# Patient Record
Sex: Female | Born: 1960 | Race: White | Hispanic: No | Marital: Married | Smoking: Never smoker
Health system: Southern US, Community
[De-identification: ages and names within clinical notes are randomized; demographics above are authoritative.]

## PROBLEM LIST (undated history)

## (undated) DIAGNOSIS — I10 Essential (primary) hypertension: Secondary | ICD-10-CM

## (undated) HISTORY — PX: ELBOW SURGERY: SHX618

## (undated) HISTORY — PX: BREAST BIOPSY: SHX20

---

## 1998-02-21 ENCOUNTER — Inpatient Hospital Stay (HOSPITAL_COMMUNITY): Admission: AD | Admit: 1998-02-21 | Discharge: 1998-02-21 | Payer: Self-pay | Admitting: Obstetrics and Gynecology

## 1998-03-03 ENCOUNTER — Inpatient Hospital Stay (HOSPITAL_COMMUNITY): Admission: AD | Admit: 1998-03-03 | Discharge: 1998-03-06 | Payer: Self-pay | Admitting: Obstetrics and Gynecology

## 1999-04-20 ENCOUNTER — Other Ambulatory Visit: Admission: RE | Admit: 1999-04-20 | Discharge: 1999-04-20 | Payer: Self-pay | Admitting: Obstetrics and Gynecology

## 1999-09-11 ENCOUNTER — Encounter: Payer: Self-pay | Admitting: *Deleted

## 1999-09-11 ENCOUNTER — Emergency Department (HOSPITAL_COMMUNITY): Admission: EM | Admit: 1999-09-11 | Discharge: 1999-09-11 | Payer: Self-pay | Admitting: *Deleted

## 2000-03-10 ENCOUNTER — Other Ambulatory Visit: Admission: RE | Admit: 2000-03-10 | Discharge: 2000-03-10 | Payer: Self-pay | Admitting: Obstetrics and Gynecology

## 2001-08-21 ENCOUNTER — Other Ambulatory Visit: Admission: RE | Admit: 2001-08-21 | Discharge: 2001-08-21 | Payer: Self-pay | Admitting: Obstetrics and Gynecology

## 2004-03-17 ENCOUNTER — Encounter: Admission: RE | Admit: 2004-03-17 | Discharge: 2004-03-17 | Payer: Self-pay | Admitting: Obstetrics and Gynecology

## 2012-12-04 ENCOUNTER — Other Ambulatory Visit (HOSPITAL_COMMUNITY): Payer: Self-pay | Admitting: Family Medicine

## 2012-12-04 DIAGNOSIS — Z139 Encounter for screening, unspecified: Secondary | ICD-10-CM

## 2012-12-07 ENCOUNTER — Ambulatory Visit (HOSPITAL_COMMUNITY)
Admission: RE | Admit: 2012-12-07 | Discharge: 2012-12-07 | Disposition: A | Discharge status: Home / Self Care | Payer: 59 | Source: Ambulatory Visit | Attending: Family Medicine | Admitting: Family Medicine

## 2012-12-07 DIAGNOSIS — Z1231 Encounter for screening mammogram for malignant neoplasm of breast: Secondary | ICD-10-CM | POA: Insufficient documentation

## 2012-12-07 DIAGNOSIS — Z139 Encounter for screening, unspecified: Secondary | ICD-10-CM

## 2012-12-13 ENCOUNTER — Other Ambulatory Visit: Payer: Self-pay | Admitting: Family Medicine

## 2012-12-13 DIAGNOSIS — R928 Other abnormal and inconclusive findings on diagnostic imaging of breast: Secondary | ICD-10-CM

## 2012-12-18 ENCOUNTER — Ambulatory Visit
Admission: RE | Admit: 2012-12-18 | Discharge: 2012-12-18 | Disposition: A | Discharge status: Home / Self Care | Payer: 59 | Source: Ambulatory Visit | Attending: Family Medicine | Admitting: Family Medicine

## 2012-12-18 ENCOUNTER — Other Ambulatory Visit: Payer: Self-pay | Admitting: Family Medicine

## 2012-12-18 DIAGNOSIS — R928 Other abnormal and inconclusive findings on diagnostic imaging of breast: Secondary | ICD-10-CM

## 2012-12-20 ENCOUNTER — Ambulatory Visit
Admission: RE | Admit: 2012-12-20 | Discharge: 2012-12-20 | Disposition: A | Discharge status: Home / Self Care | Payer: 59 | Source: Ambulatory Visit | Attending: Family Medicine | Admitting: Family Medicine

## 2012-12-20 ENCOUNTER — Other Ambulatory Visit: Payer: Self-pay | Admitting: Family Medicine

## 2012-12-20 DIAGNOSIS — R928 Other abnormal and inconclusive findings on diagnostic imaging of breast: Secondary | ICD-10-CM

## 2016-04-29 ENCOUNTER — Other Ambulatory Visit: Payer: Self-pay | Admitting: Family Medicine

## 2016-04-29 DIAGNOSIS — N63 Unspecified lump in unspecified breast: Secondary | ICD-10-CM

## 2016-05-04 ENCOUNTER — Other Ambulatory Visit: Payer: Self-pay

## 2016-05-06 ENCOUNTER — Ambulatory Visit
Admission: RE | Admit: 2016-05-06 | Discharge: 2016-05-06 | Disposition: A | Discharge status: Home / Self Care | Payer: Managed Care, Other (non HMO) | Source: Ambulatory Visit | Attending: Family Medicine | Admitting: Family Medicine

## 2016-05-06 DIAGNOSIS — N63 Unspecified lump in unspecified breast: Secondary | ICD-10-CM

## 2016-10-18 ENCOUNTER — Other Ambulatory Visit (HOSPITAL_COMMUNITY): Payer: Self-pay | Admitting: Family Medicine

## 2016-10-18 ENCOUNTER — Ambulatory Visit (HOSPITAL_COMMUNITY)
Admission: RE | Admit: 2016-10-18 | Discharge: 2016-10-18 | Disposition: A | Discharge status: Home / Self Care | Payer: Managed Care, Other (non HMO) | Source: Ambulatory Visit | Attending: Family Medicine | Admitting: Family Medicine

## 2016-10-18 DIAGNOSIS — Z1389 Encounter for screening for other disorder: Secondary | ICD-10-CM | POA: Insufficient documentation

## 2016-10-18 DIAGNOSIS — J209 Acute bronchitis, unspecified: Secondary | ICD-10-CM

## 2016-10-18 DIAGNOSIS — J069 Acute upper respiratory infection, unspecified: Secondary | ICD-10-CM

## 2017-04-07 ENCOUNTER — Other Ambulatory Visit: Payer: Self-pay | Admitting: Family Medicine

## 2017-04-07 DIAGNOSIS — Z1231 Encounter for screening mammogram for malignant neoplasm of breast: Secondary | ICD-10-CM

## 2017-05-10 ENCOUNTER — Ambulatory Visit
Admission: RE | Admit: 2017-05-10 | Discharge: 2017-05-10 | Disposition: A | Discharge status: Home / Self Care | Payer: Managed Care, Other (non HMO) | Source: Ambulatory Visit | Attending: Family Medicine | Admitting: Family Medicine

## 2017-05-10 DIAGNOSIS — Z1231 Encounter for screening mammogram for malignant neoplasm of breast: Secondary | ICD-10-CM

## 2018-04-13 ENCOUNTER — Other Ambulatory Visit: Payer: Self-pay | Admitting: Family Medicine

## 2018-04-13 DIAGNOSIS — Z1231 Encounter for screening mammogram for malignant neoplasm of breast: Secondary | ICD-10-CM

## 2018-05-12 ENCOUNTER — Ambulatory Visit
Admission: RE | Admit: 2018-05-12 | Discharge: 2018-05-12 | Disposition: A | Discharge status: Home / Self Care | Payer: Self-pay | Source: Ambulatory Visit | Attending: Family Medicine | Admitting: Family Medicine

## 2018-05-12 DIAGNOSIS — Z1231 Encounter for screening mammogram for malignant neoplasm of breast: Secondary | ICD-10-CM

## 2019-04-05 ENCOUNTER — Encounter (HOSPITAL_COMMUNITY): Payer: Self-pay | Admitting: Emergency Medicine

## 2019-04-05 ENCOUNTER — Emergency Department (HOSPITAL_COMMUNITY)
Admission: EM | Admit: 2019-04-05 | Discharge: 2019-04-06 | Disposition: A | Discharge status: Home / Self Care | Payer: 59 | Attending: Emergency Medicine | Admitting: Emergency Medicine

## 2019-04-05 ENCOUNTER — Other Ambulatory Visit: Payer: Self-pay

## 2019-04-05 DIAGNOSIS — T7840XA Allergy, unspecified, initial encounter: Secondary | ICD-10-CM | POA: Insufficient documentation

## 2019-04-05 DIAGNOSIS — L509 Urticaria, unspecified: Secondary | ICD-10-CM | POA: Diagnosis not present

## 2019-04-05 DIAGNOSIS — L299 Pruritus, unspecified: Secondary | ICD-10-CM | POA: Diagnosis present

## 2019-04-05 MED ORDER — METHYLPREDNISOLONE SODIUM SUCC 125 MG IJ SOLR
125.0000 mg | Freq: Once | INTRAMUSCULAR | Status: DC
  Filled 2019-04-05: qty 2

## 2019-04-05 MED ORDER — DIPHENHYDRAMINE HCL 50 MG/ML IJ SOLN
25.0000 mg | Freq: Once | INTRAMUSCULAR | Status: AC
  Administered 2019-04-05: 25 mg via INTRAVENOUS
  Filled 2019-04-05: qty 1

## 2019-04-05 MED ORDER — FAMOTIDINE IN NACL 20-0.9 MG/50ML-% IV SOLN
20.0000 mg | Freq: Once | INTRAVENOUS | Status: AC
  Administered 2019-04-05: 20 mg via INTRAVENOUS
  Filled 2019-04-05: qty 50

## 2019-04-05 NOTE — ED Notes (Signed)
Medications held at this time- Dr Tomi Bamberger made aware- pt may have had steroid shot today already.

## 2019-04-05 NOTE — ED Triage Notes (Signed)
Patient complaining of an allergic reaction. Patient denies any trouble breathing but has hives on her stomach and is itching. Patient was seen at the doctor's office for poison oak and given a shot and cream while she was there. Hives occurred tonight.

## 2019-04-06 NOTE — Discharge Instructions (Addendum)
Stay cool, heat will make the rash and itching worse.  Take Pepcid AC over-the-counter twice a day for at least a week.  You can take Claritin over-the-counter once a day.  Take Benadryl 25 to 50 mg every 6 hours as needed for itching not controlled by the Claritin.  Return to the emergency department if you have trouble breathing or swallowing.  Please call your doctor's office in the morning to see what injection you received so it can be placed on your allergy list.

## 2019-04-06 NOTE — ED Provider Notes (Signed)
[facility] Provider Note   CSN: [id number] Arrival date & time: 04/05/19  2221  Time seen 11:09 PM  History   Chief Complaint Chief Complaint  Patient presents with  . Allergic Reaction    HPI Teresa Kramer is a 58 y.o. female.     HPI patient states over the July 4 weekend she was putting out mulch and started getting poison oak on her left upper inner arm, right groin, forehead, chin, and a couple of her right fingers.  She states it was not going away and she had been using alcohol and some cream on it.  She was seen at her primary care doctor today and got a shot about 10:30 AM that I presume is probably a steroid shot.  She also was given a prescription for clobetasol.  She states she woke up about 9 PM itching all over and now has a urticarial type rash on her abdomen, arms, and itching on her feet.  She denies any difficulty speaking or swallowing.  She states she is never had a reaction to steroid shots in the past.  She states she did not use the clobetasol until she woke up itching.  PCP Assunta FoundGolding, John, MD   History reviewed. No pertinent past medical history.  There are no active problems to display for this patient.   Past Surgical History:  Procedure Laterality Date  . BREAST BIOPSY Left      OB History   No obstetric history on file.      Home Medications    Prior to Admission medications   Not on File    Family History Family History  Problem Relation Age of Onset  . Breast cancer Neg Hx     Social History Social History   Tobacco Use  . Smoking status: Never Smoker  . Smokeless tobacco: Never Used  Substance Use Topics  . Alcohol use: Not on file  . Drug use: Not on file  lives with spouse Denies alcohol use   Allergies   Patient has no known allergies.   Review of Systems Review of Systems  All other systems reviewed and are negative.    Physical Exam Updated Vital Signs BP (!) 135/96 (BP Location:  Right Arm)   Pulse 94   Temp 97.8 F (36.6 C) (Oral)   Resp 18   Ht 5\' 3"  (1.6 m)   Wt 95.3 kg   SpO2 96%   BMI 37.20 kg/m   Vital signs normal    Physical Exam Vitals signs and nursing note reviewed.  Constitutional:      General: She is not in acute distress.    Appearance: Normal appearance.  HENT:     Head: Normocephalic and atraumatic.     Right Ear: External ear normal.     Left Ear: External ear normal.     Nose: Nose normal.     Mouth/Throat:     Mouth: Mucous membranes are dry.  Eyes:     Extraocular Movements: Extraocular movements intact.     Conjunctiva/sclera: Conjunctivae normal.     Pupils: Pupils are equal, round, and reactive to light.  Neck:     Musculoskeletal: Normal range of motion.  Cardiovascular:     Rate and Rhythm: Normal rate and regular rhythm.     Pulses: Normal pulses.     Heart sounds: Normal heart sounds.  Pulmonary:     Effort: Pulmonary effort is normal. No respiratory distress.  Breath sounds: Normal breath sounds.  Musculoskeletal: Normal range of motion.        General: No swelling.  Skin:    Comments: Patient has a urticarial type coalescing rash in her right abdomen and a few scattered lesions behind her knees in the popliteal area and in her left upper inner arm.  She also has a drying area in her right inguinal area that was her poison oak and a few lesions on her left upper inner arm that she states was present before.  Neurological:     General: No focal deficit present.     Mental Status: She is alert and oriented to person, place, and time.     Cranial Nerves: No cranial nerve deficit.  Psychiatric:        Mood and Affect: Mood normal.        Behavior: Behavior normal.        Thought Content: Thought content normal.      ED Treatments / Results  Labs (all labs ordered are listed, but only abnormal results are displayed) Labs Reviewed - No data to display  EKG None  Radiology No results found.  Procedures  Procedures (including critical care time)  Medications Ordered in ED Medications  diphenhydrAMINE (BENADRYL) injection 25 mg (25 mg Intravenous Given 04/05/19 2343)  famotidine (PEPCID) IVPB 20 mg premix (0 mg Intravenous Stopped 04/06/19 0033)     Initial Impression / Assessment and Plan / ED Course  I have reviewed the triage vital signs and the nursing notes.  Pertinent labs & imaging results that were available during my care of the patient were reviewed by me and considered in my medical decision making (see chart for details).       Patient was given IV Pepcid and Benadryl.  She was not given IV Solu-Medrol.  Recheck 1:10 AM patient's rash is almost gone.  She states her itching is gone.  She was discharged home.  She is going to call her primary care doctor's office this morning to see what she was given so she can put it on her allergy list.  Final Clinical Impressions(s) / ED Diagnoses   Final diagnoses:  Allergic reaction, initial encounter  Urticarial rash    ED Discharge Orders    None    OTC Claritin, Pepcid, benadryl  Plan discharge  Rolland Porter, MD, Barbette Or, MD 04/06/19 [id number]

## 2019-06-28 ENCOUNTER — Encounter (HOSPITAL_COMMUNITY): Payer: Self-pay | Admitting: Emergency Medicine

## 2019-06-28 ENCOUNTER — Emergency Department (HOSPITAL_COMMUNITY)
Admission: EM | Admit: 2019-06-28 | Discharge: 2019-06-28 | Disposition: A | Discharge status: Home / Self Care | Payer: 59 | Attending: Emergency Medicine | Admitting: Emergency Medicine

## 2019-06-28 ENCOUNTER — Other Ambulatory Visit: Payer: Self-pay

## 2019-06-28 ENCOUNTER — Emergency Department (HOSPITAL_COMMUNITY): Payer: 59

## 2019-06-28 DIAGNOSIS — I10 Essential (primary) hypertension: Secondary | ICD-10-CM | POA: Diagnosis not present

## 2019-06-28 DIAGNOSIS — R079 Chest pain, unspecified: Secondary | ICD-10-CM | POA: Diagnosis not present

## 2019-06-28 DIAGNOSIS — M79602 Pain in left arm: Secondary | ICD-10-CM | POA: Insufficient documentation

## 2019-06-28 HISTORY — DX: Essential (primary) hypertension: I10

## 2019-06-28 LAB — BASIC METABOLIC PANEL
Anion gap: 8 (ref 5–15)
BUN: 11 mg/dL (ref 6–20)
CO2: 27 mmol/L (ref 22–32)
Calcium: 9.2 mg/dL (ref 8.9–10.3)
Chloride: 102 mmol/L (ref 98–111)
Creatinine, Ser: 0.74 mg/dL (ref 0.44–1.00)
GFR calc Af Amer: >60 mL/min (ref >60)
GFR calc non Af Amer: >60 mL/min (ref >60)
Glucose, Bld: 95 mg/dL (ref 70–99)
Potassium: 3.6 mmol/L (ref 3.5–5.1)
Sodium: 137 mmol/L (ref 135–145)

## 2019-06-28 LAB — CBC
HCT: 42.2 % (ref 36.0–46.0)
Hemoglobin: 13.6 g/dL (ref 12.0–15.0)
MCH: 30.2 pg (ref 26.0–34.0)
MCHC: 32.2 g/dL (ref 30.0–36.0)
MCV: 93.6 fL (ref 80.0–100.0)
Platelets: 245 10*3/uL (ref 150–400)
RBC: 4.51 MIL/uL (ref 3.87–5.11)
RDW: 12.1 % (ref 11.5–15.5)
WBC: 9.8 10*3/uL (ref 4.0–10.5)
nRBC: 0 % (ref 0.0–0.2)

## 2019-06-28 LAB — TROPONIN I (HIGH SENSITIVITY)
Troponin I (High Sensitivity): <2 ng/L (ref <18)
Troponin I (High Sensitivity): 3 ng/L (ref <18)

## 2019-06-28 MED ORDER — SODIUM CHLORIDE 0.9% FLUSH: 3.0000 mL | Freq: Once | INTRAVENOUS | Status: DC

## 2019-06-28 NOTE — ED Provider Notes (Signed)
[facility] Emergency Department Provider Note MRN:  [id number]  Arrival date & time: 06/28/19     Chief Complaint   Arm Pain   History of Present Illness   Teresa Kramer is a 58 y.o. year-old female with a history of HTN presenting to the ED with chief complaint of arm pain.  Location: Left arm Duration: 30 minutes Onset: Sudden Timing: Constant Description: "Just hurt real bad" Severity: Moderate Exacerbating/Alleviating Factors: None Associated Symptoms: "Just felt real funny", mild headache Pertinent Negatives: Denies diaphoresis, no dizziness, no nausea, no vomiting, no shortness of breath, no chest pain, no abdominal pain, no numbness or weakness to the arms or legs.   Review of Systems  A complete 10 system review of systems was obtained and all systems are negative except as noted in the HPI and PMH.   Patient's Health History    Past Medical History:  Diagnosis Date  . Hypertension     Past Surgical History:  Procedure Laterality Date  . BREAST BIOPSY Left   . ELBOW SURGERY      Family History  Problem Relation Age of Onset  . Breast cancer Neg Hx     Social History   Socioeconomic History  . Marital status: Married    Spouse name: Not on file  . Number of children: Not on file  . Years of education: Not on file  . Highest education level: Not on file  Occupational History  . Not on file  Social Needs  . Financial resource strain: Not on file  . Food insecurity    Worry: Not on file    Inability: Not on file  . Transportation needs    Medical: Not on file    Non-medical: Not on file  Tobacco Use  . Smoking status: Never Smoker  . Smokeless tobacco: Never Used  Substance and Sexual Activity  . Alcohol use: Not on file  . Drug use: Not on file  . Sexual activity: Not on file  Lifestyle  . Physical activity    Days per week: Not on file    Minutes per session: Not on file  . Stress: Not on file  Relationships  .  Social Herbalist on phone: Not on file    Gets together: Not on file    Attends religious service: Not on file    Active member of club or organization: Not on file    Attends meetings of clubs or organizations: Not on file    Relationship status: Not on file  . Intimate partner violence    Fear of current or ex partner: Not on file    Emotionally abused: Not on file    Physically abused: Not on file    Forced sexual activity: Not on file  Other Topics Concern  . Not on file  Social History Narrative  . Not on file     Physical Exam  Vital Signs and Nursing Notes reviewed Vitals:   06/28/19 1409  BP: (!) 171/82  Pulse: 75  Resp: 18  Temp: 98 F (36.7 C)  SpO2: 98%    CONSTITUTIONAL: Well-appearing, NAD NEURO:  Alert and oriented x 3, no focal deficits EYES:  eyes equal and reactive ENT/NECK:  no LAD, no JVD CARDIO: Regular rate, well-perfused, normal S1 and S2 PULM:  CTAB no wheezing or rhonchi GI/GU:  normal bowel sounds, non-distended, non-tender MSK/SPINE:  No gross deformities, no edema SKIN:  no rash, atraumatic PSYCH:  Appropriate speech and behavior  Diagnostic and Interventional Summary    EKG Interpretation  Date/Time:  Thursday June 28 2019 14:04:38 EDT Ventricular Rate:  71 PR Interval:  174 QRS Duration: 102 QT Interval:  378 QTC Calculation: 410 R Axis:   -44 Text Interpretation:  Normal sinus rhythm Left axis deviation Minimal voltage criteria for LVH, may be normal variant Possible Anterior infarct , age undetermined Abnormal ECG No old tracing to compare Confirmed by Meridee Score [phone]) on 06/28/2019 2:13:42 PM      Labs Reviewed  BASIC METABOLIC PANEL  CBC  TROPONIN I (HIGH SENSITIVITY)  TROPONIN I (HIGH SENSITIVITY)    DG Chest 2 View  Final Result      Medications  sodium chloride flush (NS) 0.9 % injection 3 mL (has no administration in time range)     Procedures Critical Care  ED Course and Medical Decision  Making  I have reviewed the triage vital signs and the nursing notes.  Pertinent labs & imaging results that were available during my care of the patient were reviewed by me and considered in my medical decision making (see below for details).  Considering atypical presentation of ACS with referred pain to the left arm in this 59 year old female with history of hypertension but otherwise does not smoke, has no cardiac history.  Her mother just passed away 3 weeks ago and there may be a component of anxiety contributing to her symptoms.  She also had onset of pain after lunch, suggestive of a possible GI etiology.  EKG is without acute concerns, troponin is pending.  No evidence of DVT on exam, little to no concern for pulmonary embolism.  First troponin is negative, per high-sensitivity troponin protocol and her very low heart score she is a candidate for early discharge.  Elmer Sow. Pilar Plate, MD [facility] Emergency Medicine [facility] [email or web address]  Final Clinical Impressions(s) / ED Diagnoses     ICD-10-CM   1. Left arm pain  M79.602     ED Discharge Orders    None      Discharge Instructions Discussed with and Provided to Patient:   Discharge Instructions     You were evaluated in the Emergency Department and after careful evaluation, we did not find any emergent condition requiring admission or further testing in the hospital.  Please return to the Emergency Department if you experience any worsening of your condition.  We encourage you to follow up with a primary care provider.  Thank you for allowing [place] to be a part of your care.       Sabas Sous, MD 06/28/19 1705

## 2019-06-28 NOTE — Discharge Instructions (Addendum)
You were evaluated in the Emergency Department and after careful evaluation, we did not find any emergent condition requiring admission or further testing in the hospital. ° °Please return to the Emergency Department if you experience any worsening of your condition.  We encourage you to follow up with a primary care provider.  Thank you for allowing us to be a part of your care. °

## 2019-06-28 NOTE — ED Triage Notes (Addendum)
Pt c/o of left arm pain and high blood pressure this morning.  Pain is better but still present. On HTN meds but not consistent. Mild chest pressure

## 2019-06-29 LAB — POC OCCULT BLOOD, ED: Fecal Occult Bld: NEGATIVE

## 2019-07-03 ENCOUNTER — Other Ambulatory Visit: Payer: Self-pay | Admitting: Family Medicine

## 2019-07-03 DIAGNOSIS — Z1231 Encounter for screening mammogram for malignant neoplasm of breast: Secondary | ICD-10-CM

## 2019-08-20 ENCOUNTER — Other Ambulatory Visit: Payer: Self-pay

## 2019-08-20 ENCOUNTER — Ambulatory Visit
Admission: RE | Admit: 2019-08-20 | Discharge: 2019-08-20 | Disposition: A | Discharge status: Home / Self Care | Payer: 59 | Source: Ambulatory Visit | Attending: Family Medicine | Admitting: Family Medicine

## 2019-08-20 DIAGNOSIS — Z1231 Encounter for screening mammogram for malignant neoplasm of breast: Secondary | ICD-10-CM

## 2019-08-22 ENCOUNTER — Other Ambulatory Visit: Payer: Self-pay | Admitting: Family Medicine

## 2019-08-22 DIAGNOSIS — R928 Other abnormal and inconclusive findings on diagnostic imaging of breast: Secondary | ICD-10-CM

## 2019-08-31 ENCOUNTER — Other Ambulatory Visit: Payer: Self-pay

## 2019-08-31 ENCOUNTER — Ambulatory Visit: Payer: 59

## 2019-08-31 ENCOUNTER — Ambulatory Visit
Admission: RE | Admit: 2019-08-31 | Discharge: 2019-08-31 | Disposition: A | Discharge status: Home / Self Care | Payer: 59 | Source: Ambulatory Visit | Attending: Family Medicine | Admitting: Family Medicine

## 2019-08-31 DIAGNOSIS — R928 Other abnormal and inconclusive findings on diagnostic imaging of breast: Secondary | ICD-10-CM

## 2019-09-22 ENCOUNTER — Emergency Department (HOSPITAL_COMMUNITY): Payer: 59

## 2019-09-22 ENCOUNTER — Emergency Department (HOSPITAL_COMMUNITY)
Admission: EM | Admit: 2019-09-22 | Discharge: 2019-09-22 | Disposition: A | Discharge status: Home / Self Care | Payer: 59 | Attending: Emergency Medicine | Admitting: Emergency Medicine

## 2019-09-22 ENCOUNTER — Other Ambulatory Visit: Payer: Self-pay

## 2019-09-22 ENCOUNTER — Encounter (HOSPITAL_COMMUNITY): Payer: Self-pay | Admitting: Emergency Medicine

## 2019-09-22 DIAGNOSIS — R079 Chest pain, unspecified: Secondary | ICD-10-CM | POA: Diagnosis present

## 2019-09-22 DIAGNOSIS — I10 Essential (primary) hypertension: Secondary | ICD-10-CM | POA: Diagnosis not present

## 2019-09-22 DIAGNOSIS — R1011 Right upper quadrant pain: Secondary | ICD-10-CM | POA: Insufficient documentation

## 2019-09-22 DIAGNOSIS — R0789 Other chest pain: Secondary | ICD-10-CM | POA: Diagnosis not present

## 2019-09-22 LAB — COMPREHENSIVE METABOLIC PANEL
ALT: 22 U/L (ref 0–44)
AST: 21 U/L (ref 15–41)
Albumin: 4.6 g/dL (ref 3.5–5.0)
Alkaline Phosphatase: 75 U/L (ref 38–126)
Anion gap: 10 (ref 5–15)
BUN: 11 mg/dL (ref 6–20)
CO2: 25 mmol/L (ref 22–32)
Calcium: 9.1 mg/dL (ref 8.9–10.3)
Chloride: 105 mmol/L (ref 98–111)
Creatinine, Ser: 0.67 mg/dL (ref 0.44–1.00)
GFR calc Af Amer: >60 mL/min (ref >60)
GFR calc non Af Amer: >60 mL/min (ref >60)
Glucose, Bld: 111 mg/dL — ABNORMAL HIGH (ref 70–99)
Potassium: 3.7 mmol/L (ref 3.5–5.1)
Sodium: 140 mmol/L (ref 135–145)
Total Bilirubin: 0.4 mg/dL (ref 0.3–1.2)
Total Protein: 8 g/dL (ref 6.5–8.1)

## 2019-09-22 LAB — URINALYSIS, ROUTINE W REFLEX MICROSCOPIC
Bilirubin Urine: NEGATIVE
Glucose, UA: NEGATIVE mg/dL
Hgb urine dipstick: NEGATIVE
Ketones, ur: NEGATIVE mg/dL
Leukocytes,Ua: NEGATIVE
Nitrite: NEGATIVE
Protein, ur: NEGATIVE mg/dL
Specific Gravity, Urine: 1.013 (ref 1.005–1.030)
pH: 8 (ref 5.0–8.0)

## 2019-09-22 LAB — CBC WITH DIFFERENTIAL/PLATELET
Abs Immature Granulocytes: 0.1 10*3/uL — ABNORMAL HIGH (ref 0.00–0.07)
Basophils Absolute: 0.1 10*3/uL (ref 0.0–0.1)
Basophils Relative: 1 %
Eosinophils Absolute: 0.1 10*3/uL (ref 0.0–0.5)
Eosinophils Relative: 0 %
HCT: 43.3 % (ref 36.0–46.0)
Hemoglobin: 14.1 g/dL (ref 12.0–15.0)
Immature Granulocytes: 1 %
Lymphocytes Relative: 10 %
Lymphs Abs: 1.5 10*3/uL (ref 0.7–4.0)
MCH: 30.4 pg (ref 26.0–34.0)
MCHC: 32.6 g/dL (ref 30.0–36.0)
MCV: 93.3 fL (ref 80.0–100.0)
Monocytes Absolute: 0.9 10*3/uL (ref 0.1–1.0)
Monocytes Relative: 6 %
Neutro Abs: 12.8 10*3/uL — ABNORMAL HIGH (ref 1.7–7.7)
Neutrophils Relative %: 82 %
Platelets: 275 10*3/uL (ref 150–400)
RBC: 4.64 MIL/uL (ref 3.87–5.11)
RDW: 12.3 % (ref 11.5–15.5)
WBC: 15.4 10*3/uL — ABNORMAL HIGH (ref 4.0–10.5)
nRBC: 0 % (ref 0.0–0.2)

## 2019-09-22 MED ORDER — MORPHINE SULFATE (PF) 4 MG/ML IV SOLN
4.0000 mg | Freq: Once | INTRAVENOUS | Status: AC
  Administered 2019-09-22: 20:00:00 4 mg via INTRAVENOUS
  Filled 2019-09-22: qty 1

## 2019-09-22 MED ORDER — LIDOCAINE 4 % EX CREA: 1.0000 "application " | TOPICAL_CREAM | Freq: Three times a day (TID) | CUTANEOUS | 0 refills | Status: DC | PRN

## 2019-09-22 MED ORDER — CYCLOBENZAPRINE HCL 10 MG PO TABS: 10.0000 mg | ORAL_TABLET | Freq: Two times a day (BID) | ORAL | 0 refills | Status: AC | PRN

## 2019-09-22 MED ORDER — ACETAMINOPHEN 500 MG PO TABS: 500.0000 mg | ORAL_TABLET | Freq: Four times a day (QID) | ORAL | 0 refills | Status: AC | PRN

## 2019-09-22 MED ORDER — LIDOCAINE 4 % EX CREA: 1.0000 "application " | TOPICAL_CREAM | Freq: Three times a day (TID) | CUTANEOUS | 0 refills | Status: AC | PRN

## 2019-09-22 MED ORDER — IBUPROFEN 600 MG PO TABS: 600.0000 mg | ORAL_TABLET | Freq: Four times a day (QID) | ORAL | 0 refills | Status: AC | PRN

## 2019-09-22 MED ORDER — IOHEXOL 300 MG/ML  SOLN
100.0000 mL | Freq: Once | INTRAMUSCULAR | Status: AC | PRN
  Administered 2019-09-22: 100 mL via INTRAVENOUS

## 2019-09-22 NOTE — ED Notes (Signed)
Call to CT re results   CT being read by Rad now
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
## 2019-09-22 NOTE — Discharge Instructions (Addendum)
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
## 2022-03-31 ENCOUNTER — Other Ambulatory Visit: Payer: Self-pay | Admitting: Family Medicine

## 2022-03-31 DIAGNOSIS — Z1231 Encounter for screening mammogram for malignant neoplasm of breast: Secondary | ICD-10-CM

## 2022-04-16 ENCOUNTER — Ambulatory Visit
Admission: RE | Admit: 2022-04-16 | Discharge: 2022-04-16 | Disposition: A | Discharge status: Home / Self Care | Payer: Commercial Managed Care - HMO | Source: Ambulatory Visit | Attending: Family Medicine | Admitting: Family Medicine

## 2022-04-16 DIAGNOSIS — Z1231 Encounter for screening mammogram for malignant neoplasm of breast: Secondary | ICD-10-CM

## 2023-02-25 ENCOUNTER — Encounter: Payer: Self-pay | Admitting: Family Medicine

## 2023-02-25 ENCOUNTER — Other Ambulatory Visit: Payer: Self-pay | Admitting: Family Medicine

## 2023-02-25 DIAGNOSIS — Z1231 Encounter for screening mammogram for malignant neoplasm of breast: Secondary | ICD-10-CM

## 2023-03-14 ENCOUNTER — Other Ambulatory Visit: Payer: Self-pay | Admitting: Family Medicine

## 2023-03-14 DIAGNOSIS — Z1231 Encounter for screening mammogram for malignant neoplasm of breast: Secondary | ICD-10-CM

## 2023-05-27 ENCOUNTER — Ambulatory Visit: Admission: RE | Admit: 2023-05-27 | Payer: Commercial Managed Care - HMO | Source: Ambulatory Visit

## 2023-05-27 ENCOUNTER — Ambulatory Visit: Payer: Commercial Managed Care - HMO

## 2023-05-27 DIAGNOSIS — Z1231 Encounter for screening mammogram for malignant neoplasm of breast: Secondary | ICD-10-CM

## 2023-06-01 ENCOUNTER — Other Ambulatory Visit: Payer: Self-pay | Admitting: Family Medicine

## 2023-06-01 DIAGNOSIS — R928 Other abnormal and inconclusive findings on diagnostic imaging of breast: Secondary | ICD-10-CM

## 2023-06-14 ENCOUNTER — Ambulatory Visit
Admission: RE | Admit: 2023-06-14 | Discharge: 2023-06-14 | Disposition: A | Discharge status: Home / Self Care | Payer: Managed Care, Other (non HMO) | Source: Ambulatory Visit | Attending: Family Medicine | Admitting: Family Medicine

## 2023-06-14 ENCOUNTER — Ambulatory Visit
Admission: RE | Admit: 2023-06-14 | Discharge: 2023-06-14 | Disposition: A | Discharge status: Home / Self Care | Payer: Commercial Managed Care - HMO | Source: Ambulatory Visit | Attending: Family Medicine | Admitting: Family Medicine

## 2023-06-14 DIAGNOSIS — R928 Other abnormal and inconclusive findings on diagnostic imaging of breast: Secondary | ICD-10-CM

## 2023-06-16 ENCOUNTER — Other Ambulatory Visit: Payer: Self-pay | Admitting: Family Medicine

## 2023-06-16 DIAGNOSIS — R928 Other abnormal and inconclusive findings on diagnostic imaging of breast: Secondary | ICD-10-CM

## 2023-12-07 ENCOUNTER — Other Ambulatory Visit: Payer: Self-pay | Admitting: Family Medicine

## 2023-12-07 DIAGNOSIS — N6320 Unspecified lump in the left breast, unspecified quadrant: Secondary | ICD-10-CM

## 2023-12-07 DIAGNOSIS — N6002 Solitary cyst of left breast: Secondary | ICD-10-CM

## 2023-12-13 ENCOUNTER — Ambulatory Visit
Admission: RE | Admit: 2023-12-13 | Discharge: 2023-12-13 | Disposition: A | Discharge status: Home / Self Care | Payer: Managed Care, Other (non HMO) | Source: Ambulatory Visit | Attending: Family Medicine | Admitting: Family Medicine

## 2023-12-13 ENCOUNTER — Other Ambulatory Visit: Payer: Self-pay | Admitting: Family Medicine

## 2023-12-13 DIAGNOSIS — N6002 Solitary cyst of left breast: Secondary | ICD-10-CM

## 2023-12-13 DIAGNOSIS — N6320 Unspecified lump in the left breast, unspecified quadrant: Secondary | ICD-10-CM

## 2024-06-18 ENCOUNTER — Ambulatory Visit
Admission: RE | Admit: 2024-06-18 | Discharge: 2024-06-18 | Disposition: A | Discharge status: Home / Self Care | Source: Ambulatory Visit | Attending: Family Medicine | Admitting: Family Medicine

## 2024-06-18 DIAGNOSIS — N6002 Solitary cyst of left breast: Secondary | ICD-10-CM

## 2024-06-18 DIAGNOSIS — N6320 Unspecified lump in the left breast, unspecified quadrant: Secondary | ICD-10-CM
